# Patient Record
Sex: Male | Born: 1970 | Race: White | Hispanic: No | Marital: Single
Health system: Southern US, Community
[De-identification: ages and names within clinical notes are randomized; demographics above are authoritative.]

---

## 2007-05-24 ENCOUNTER — Ambulatory Visit: Payer: Self-pay | Admitting: Internal Medicine

## 2007-05-24 LAB — CONVERTED CEMR LAB
ALT: 174 units/L — ABNORMAL HIGH (ref 0–53)
AST: 73 units/L — ABNORMAL HIGH (ref 0–37)
Albumin: 4.2 g/dL (ref 3.5–5.2)
Alkaline Phosphatase: 45 units/L (ref 39–117)
BUN: 8 mg/dL (ref 6–23)
Bacteria, UA: NEGATIVE
Basophils Absolute: 0.1 10*3/uL (ref 0.0–0.1)
Basophils Relative: 0.6 % (ref 0.0–1.0)
Bilirubin, Direct: 0.2 mg/dL (ref 0.0–0.3)
CO2: 30 meq/L (ref 19–32)
Calcium: 9.4 mg/dL (ref 8.4–10.5)
Chloride: 107 meq/L (ref 96–112)
Cholesterol: 185 mg/dL (ref 0–200)
Creatinine, Ser: 0.8 mg/dL (ref 0.4–1.5)
Crystals: NEGATIVE
Eosinophils Absolute: 0.1 10*3/uL (ref 0.0–0.6)
Eosinophils Relative: 1 % (ref 0.0–5.0)
GFR calc Af Amer: 140 mL/min
GFR calc non Af Amer: 116 mL/min
Glucose, Bld: 99 mg/dL (ref 70–99)
HCT: 49.8 % (ref 39.0–52.0)
HDL: 39.1 mg/dL (ref >39.0)
Hemoglobin: 17.5 g/dL — ABNORMAL HIGH (ref 13.0–17.0)
LDL Cholesterol: 124 mg/dL — ABNORMAL HIGH (ref 0–99)
Leukocytes, UA: NEGATIVE
Lymphocytes Relative: 29.2 % (ref 12.0–46.0)
MCHC: 35.2 g/dL (ref 30.0–36.0)
MCV: 92.6 fL (ref 78.0–100.0)
Monocytes Absolute: 0.8 10*3/uL — ABNORMAL HIGH (ref 0.2–0.7)
Monocytes Relative: 8.9 % (ref 3.0–11.0)
Neutro Abs: 5.5 10*3/uL (ref 1.4–7.7)
Neutrophils Relative %: 60.3 % (ref 43.0–77.0)
Nitrite: NEGATIVE
Platelets: 274 10*3/uL (ref 150–400)
Potassium: 4.2 meq/L (ref 3.5–5.1)
RBC: 5.38 M/uL (ref 4.22–5.81)
RDW: 12.1 % (ref 11.5–14.6)
Sodium: 142 meq/L (ref 135–145)
Specific Gravity, Urine: 1.025 (ref 1.000–1.03)
TSH: 1.1 microintl units/mL (ref 0.35–5.50)
Total Bilirubin: 1.3 mg/dL — ABNORMAL HIGH (ref 0.3–1.2)
Total CHOL/HDL Ratio: 4.7
Total Protein, Urine: 30 mg/dL — AB
Total Protein: 7.4 g/dL (ref 6.0–8.3)
Triglycerides: 110 mg/dL (ref 0–149)
Urine Glucose: NEGATIVE mg/dL
Urobilinogen, UA: 0.2 (ref 0.0–1.0)
VLDL: 22 mg/dL (ref 0–40)
WBC: 9.2 10*3/uL (ref 4.5–10.5)
pH: 6 (ref 5.0–8.0)

## 2007-05-29 ENCOUNTER — Encounter: Payer: Self-pay | Admitting: Internal Medicine

## 2007-05-29 ENCOUNTER — Encounter: Payer: Self-pay | Admitting: *Deleted

## 2007-05-29 DIAGNOSIS — M7730 Calcaneal spur, unspecified foot: Secondary | ICD-10-CM

## 2007-05-30 ENCOUNTER — Encounter: Payer: Self-pay | Admitting: Internal Medicine

## 2007-05-30 ENCOUNTER — Ambulatory Visit: Payer: Self-pay | Admitting: Internal Medicine

## 2007-05-30 DIAGNOSIS — R319 Hematuria, unspecified: Secondary | ICD-10-CM | POA: Insufficient documentation

## 2007-05-30 DIAGNOSIS — I10 Essential (primary) hypertension: Secondary | ICD-10-CM | POA: Insufficient documentation

## 2007-05-30 DIAGNOSIS — F528 Other sexual dysfunction not due to a substance or known physiological condition: Secondary | ICD-10-CM

## 2007-05-30 DIAGNOSIS — R945 Abnormal results of liver function studies: Secondary | ICD-10-CM | POA: Insufficient documentation

## 2007-05-30 LAB — CONVERTED CEMR LAB
A-1 Antitrypsin, Ser: 165 mg/dL (ref 83–200)
Albumin ELP: 60.4 % (ref 55.8–66.1)
Alpha-1-Globulin: 3.8 % (ref 2.9–4.9)
Alpha-2-Globulin: 8.3 % (ref 7.1–11.8)
Anti Nuclear Antibody(ANA): NEGATIVE
Beta Globulin: 6.2 % (ref 4.7–7.2)
Ceruloplasmin: 41 mg/dL (ref 21–63)
Ferritin: 944.8 ng/mL — ABNORMAL HIGH (ref 22.0–322.0)
Gamma Globulin: 14.5 % (ref 11.1–18.8)
HCV Ab: NEGATIVE
Hep B S Ab: NEGATIVE
Iron: 273 ug/dL — ABNORMAL HIGH (ref 42–165)
Saturation Ratios: 93.8 % — ABNORMAL HIGH (ref 20.0–50.0)
Testosterone: 294.59 ng/dL — ABNORMAL LOW (ref 350.00–890)
Tissue Transglutaminase Ab, IgA: 0.5 units (ref <7)
Total Protein, Serum Electrophoresis: 8.1 g/dL (ref 6.0–8.3)
Transferrin: 208 mg/dL — ABNORMAL LOW (ref >=212.0)

## 2007-05-31 ENCOUNTER — Encounter: Payer: Self-pay | Admitting: Internal Medicine

## 2007-06-05 ENCOUNTER — Ambulatory Visit: Payer: Self-pay | Admitting: Cardiology

## 2007-06-28 ENCOUNTER — Ambulatory Visit: Payer: Self-pay | Admitting: Internal Medicine

## 2007-06-28 DIAGNOSIS — R319 Hematuria, unspecified: Secondary | ICD-10-CM

## 2007-06-28 LAB — CONVERTED CEMR LAB
INR: 1 (ref 0.8–1.0)
Prothrombin Time: 12.1 s (ref 10.9–13.3)

## 2007-07-01 ENCOUNTER — Encounter: Payer: Self-pay | Admitting: Internal Medicine

## 2007-07-02 ENCOUNTER — Ambulatory Visit: Payer: Self-pay | Admitting: Internal Medicine

## 2007-07-02 DIAGNOSIS — E291 Testicular hypofunction: Secondary | ICD-10-CM | POA: Insufficient documentation

## 2007-07-03 LAB — CONVERTED CEMR LAB
BUN: 7 mg/dL (ref 6–23)
CO2: 31 meq/L (ref 19–32)
Calcium: 9.4 mg/dL (ref 8.4–10.5)
Chloride: 103 meq/L (ref 96–112)
Creatinine, Ser: 0.9 mg/dL (ref 0.4–1.5)
GFR calc Af Amer: 121 mL/min
GFR calc non Af Amer: 100 mL/min
Glucose, Bld: 108 mg/dL — ABNORMAL HIGH (ref 70–99)
Potassium: 4.1 meq/L (ref 3.5–5.1)
Sodium: 140 meq/L (ref 135–145)

## 2007-07-05 ENCOUNTER — Encounter (INDEPENDENT_AMBULATORY_CARE_PROVIDER_SITE_OTHER): Payer: Self-pay | Admitting: *Deleted

## 2007-07-05 ENCOUNTER — Encounter: Payer: Self-pay | Admitting: Internal Medicine

## 2007-07-23 ENCOUNTER — Ambulatory Visit: Payer: Self-pay | Admitting: Internal Medicine

## 2007-07-30 ENCOUNTER — Ambulatory Visit: Payer: Self-pay | Admitting: Internal Medicine

## 2007-08-07 ENCOUNTER — Ambulatory Visit: Payer: Self-pay | Admitting: Endocrinology

## 2007-08-09 ENCOUNTER — Ambulatory Visit: Payer: Self-pay | Admitting: Internal Medicine

## 2007-08-10 LAB — CONVERTED CEMR LAB
FSH: 5.8 milliintl units/mL
LH: 3.9 milliintl units/mL
Prolactin: 10.9 ng/mL
Testosterone: 387.32 ng/dL (ref 350.00–890)

## 2007-08-15 ENCOUNTER — Telehealth (INDEPENDENT_AMBULATORY_CARE_PROVIDER_SITE_OTHER): Payer: Self-pay | Admitting: *Deleted

## 2007-08-16 ENCOUNTER — Ambulatory Visit: Payer: Self-pay | Admitting: Internal Medicine

## 2007-08-27 ENCOUNTER — Ambulatory Visit: Payer: Self-pay | Admitting: Internal Medicine

## 2007-08-27 LAB — CONVERTED CEMR LAB
Basophils Absolute: 0 10*3/uL (ref 0.0–0.1)
Eosinophils Absolute: 0.1 10*3/uL (ref 0.0–0.6)
Lymphocytes Relative: 27.6 % (ref 12.0–46.0)
MCHC: 33.6 g/dL (ref 30.0–36.0)
MCV: 94.5 fL (ref 78.0–100.0)
Neutro Abs: 5.1 10*3/uL (ref 1.4–7.7)
Platelets: 296 10*3/uL (ref 150–400)
RBC: 4.64 M/uL (ref 4.22–5.81)

## 2007-09-04 ENCOUNTER — Ambulatory Visit: Payer: Self-pay | Admitting: Internal Medicine

## 2007-09-04 LAB — CONVERTED CEMR LAB: Hemoglobin: 14.6 g/dL (ref 13.0–17.0)

## 2007-09-11 ENCOUNTER — Ambulatory Visit: Payer: Self-pay | Admitting: Internal Medicine

## 2007-09-11 LAB — CONVERTED CEMR LAB
Basophils Relative: 0.1 % (ref 0.0–1.0)
Eosinophils Absolute: 0.1 10*3/uL (ref 0.0–0.6)
Eosinophils Relative: 1 % (ref 0.0–5.0)
Lymphocytes Relative: 35.1 % (ref 12.0–46.0)
MCV: 94.3 fL (ref 78.0–100.0)
Monocytes Relative: 8.6 % (ref 3.0–11.0)
Neutro Abs: 4 10*3/uL (ref 1.4–7.7)
Platelets: 270 10*3/uL (ref 150–400)
RBC: 4.44 M/uL (ref 4.22–5.81)
WBC: 7.1 10*3/uL (ref 4.5–10.5)

## 2007-09-18 ENCOUNTER — Ambulatory Visit: Payer: Self-pay | Admitting: Internal Medicine

## 2007-09-18 LAB — CONVERTED CEMR LAB
HCT: 44.4 % (ref 39.0–52.0)
Hemoglobin: 15.2 g/dL (ref 13.0–17.0)

## 2007-09-25 ENCOUNTER — Ambulatory Visit: Payer: Self-pay | Admitting: Internal Medicine

## 2007-09-25 LAB — CONVERTED CEMR LAB
Basophils Relative: 3.3 % — ABNORMAL HIGH (ref 0.0–1.0)
Eosinophils Relative: 1 % (ref 0.0–5.0)
Lymphocytes Relative: 27 % (ref 12.0–46.0)
Monocytes Relative: 6.7 % (ref 3.0–11.0)
Platelets: 295 10*3/uL (ref 150–400)
RBC: 4.55 M/uL (ref 4.22–5.81)
RDW: 12.6 % (ref 11.5–14.6)
WBC: 7.1 10*3/uL (ref 4.5–10.5)

## 2007-10-01 ENCOUNTER — Ambulatory Visit: Payer: Self-pay | Admitting: Internal Medicine

## 2007-10-03 ENCOUNTER — Ambulatory Visit: Payer: Self-pay | Admitting: Internal Medicine

## 2007-10-03 LAB — CONVERTED CEMR LAB
HCT: 41.7 % (ref 39.0–52.0)
Hemoglobin: 14.4 g/dL (ref 13.0–17.0)

## 2007-10-10 ENCOUNTER — Ambulatory Visit: Payer: Self-pay | Admitting: Internal Medicine

## 2007-10-10 LAB — CONVERTED CEMR LAB
HCT: 44.8 % (ref 39.0–52.0)
Hemoglobin: 15.2 g/dL (ref 13.0–17.0)

## 2007-10-17 ENCOUNTER — Ambulatory Visit: Payer: Self-pay | Admitting: Internal Medicine

## 2007-10-17 LAB — CONVERTED CEMR LAB
HCT: 42.1 % (ref 39.0–52.0)
Hemoglobin: 14.3 g/dL (ref 13.0–17.0)

## 2007-10-24 ENCOUNTER — Ambulatory Visit: Payer: Self-pay | Admitting: Internal Medicine

## 2007-10-24 LAB — CONVERTED CEMR LAB
HCT: 44.3 % (ref 39.0–52.0)
Hemoglobin: 14.9 g/dL (ref 13.0–17.0)

## 2007-10-31 ENCOUNTER — Ambulatory Visit: Payer: Self-pay | Admitting: Internal Medicine

## 2007-10-31 LAB — CONVERTED CEMR LAB: Hemoglobin: 13.9 g/dL (ref 13.0–17.0)

## 2007-11-07 ENCOUNTER — Ambulatory Visit: Payer: Self-pay | Admitting: Internal Medicine

## 2007-11-07 LAB — CONVERTED CEMR LAB
Basophils Absolute: 0 10*3/uL (ref 0.0–0.1)
Basophils Relative: 0 % (ref 0.0–1.0)
HCT: 42.9 % (ref 39.0–52.0)
Hemoglobin: 14.5 g/dL (ref 13.0–17.0)
Lymphocytes Relative: 25.7 % (ref 12.0–46.0)
MCHC: 33.8 g/dL (ref 30.0–36.0)
MCV: 93.2 fL (ref 78.0–100.0)
Monocytes Absolute: 0.6 10*3/uL (ref 0.1–1.0)
Neutro Abs: 6.1 10*3/uL (ref 1.4–7.7)
RBC: 4.6 M/uL (ref 4.22–5.81)
RDW: 11.5 % (ref 11.5–14.6)

## 2007-11-14 ENCOUNTER — Ambulatory Visit: Payer: Self-pay | Admitting: Internal Medicine

## 2007-11-21 ENCOUNTER — Ambulatory Visit: Payer: Self-pay | Admitting: Internal Medicine

## 2007-11-21 LAB — CONVERTED CEMR LAB: HCT: 39.6 % (ref 39.0–52.0)

## 2007-11-28 ENCOUNTER — Ambulatory Visit: Payer: Self-pay | Admitting: Internal Medicine

## 2007-12-12 ENCOUNTER — Ambulatory Visit: Payer: Self-pay | Admitting: Internal Medicine

## 2007-12-16 LAB — CONVERTED CEMR LAB
HCT: 38.1 % — ABNORMAL LOW (ref 39.0–52.0)
Hemoglobin: 12.5 g/dL — ABNORMAL LOW (ref 13.0–17.0)

## 2008-01-15 ENCOUNTER — Ambulatory Visit: Payer: Self-pay | Admitting: Internal Medicine

## 2008-01-16 LAB — CONVERTED CEMR LAB
Eosinophils Relative: 0.7 % (ref 0.0–5.0)
Ferritin: 6.5 ng/mL — ABNORMAL LOW (ref 22.0–322.0)
Hemoglobin: 12.3 g/dL — ABNORMAL LOW (ref 13.0–17.0)
Lymphocytes Relative: 28 % (ref 12.0–46.0)
Monocytes Relative: 11.1 % (ref 3.0–12.0)
Neutro Abs: 4.1 10*3/uL (ref 1.4–7.7)
Platelets: 286 10*3/uL (ref 150–400)
RDW: 13.5 % (ref 11.5–14.6)
WBC: 6.8 10*3/uL (ref 4.5–10.5)

## 2008-04-03 ENCOUNTER — Telehealth: Payer: Self-pay | Admitting: Internal Medicine

## 2008-04-09 ENCOUNTER — Ambulatory Visit: Payer: Self-pay | Admitting: Internal Medicine

## 2008-04-09 LAB — CONVERTED CEMR LAB
Eosinophils Relative: 0.9 % (ref 0.0–5.0)
HCT: 42.9 % (ref 39.0–52.0)
Hemoglobin: 14.3 g/dL (ref 13.0–17.0)
Monocytes Absolute: 0.7 10*3/uL (ref 0.1–1.0)
Monocytes Relative: 8.7 % (ref 3.0–12.0)
Neutro Abs: 4.9 10*3/uL (ref 1.4–7.7)
WBC: 8.1 10*3/uL (ref 4.5–10.5)

## 2008-05-01 ENCOUNTER — Ambulatory Visit: Payer: Self-pay | Admitting: Internal Medicine

## 2008-05-01 DIAGNOSIS — H9209 Otalgia, unspecified ear: Secondary | ICD-10-CM | POA: Insufficient documentation

## 2008-07-02 ENCOUNTER — Ambulatory Visit: Payer: Self-pay | Admitting: Internal Medicine

## 2008-07-03 LAB — CONVERTED CEMR LAB
Basophils Absolute: 0 10*3/uL (ref 0.0–0.1)
Basophils Relative: 0 % (ref 0.0–3.0)
Eosinophils Absolute: 0.1 10*3/uL (ref 0.0–0.7)
MCHC: 34.9 g/dL (ref 30.0–36.0)
MCV: 83.6 fL (ref 78.0–100.0)
Neutrophils Relative %: 73.8 % (ref 43.0–77.0)
Platelets: 232 10*3/uL (ref 150–400)
RDW: 13.9 % (ref 11.5–14.6)

## 2008-09-25 ENCOUNTER — Ambulatory Visit: Payer: Self-pay | Admitting: Internal Medicine

## 2008-09-28 LAB — CONVERTED CEMR LAB
Basophils Absolute: 0 10*3/uL (ref 0.0–0.1)
Basophils Relative: 0.2 % (ref 0.0–3.0)
Lymphocytes Relative: 25.5 % (ref 12.0–46.0)
MCHC: 35.1 g/dL (ref 30.0–36.0)
Neutrophils Relative %: 66.7 % (ref 43.0–77.0)
RBC: 5.71 M/uL (ref 4.22–5.81)
WBC: 8.6 10*3/uL (ref 4.5–10.5)

## 2008-11-02 IMAGING — CT CT PELVIS W/ CM
3 of 9 series · 13 of 46 positions shown, 19 images · IV contrast (Omnipaque 300)
Comparison: none

CLINICAL DATA: Hematuria and abnormal LFTs.  
 ABDOMEN CT WITHOUT AND WITH CONTRAST:
TECHNIQUE: Multidetector CT imaging of the abdomen was performed both before and during bolus administration of intravenous contrast.
 Contrast:  125 cc Omnipaque 300
TECHNIQUE: Multidetector CT imaging of the pelvis was performed following the standard protocol during bolus administration of intravenous contrast.

[Series 5: arterial 3.0 b30f st · axial · arterial · 0.92mm/px · z∈[-250,-214]mm · 2 of 97 slices shown]
[im 13/97  soft-tissue]
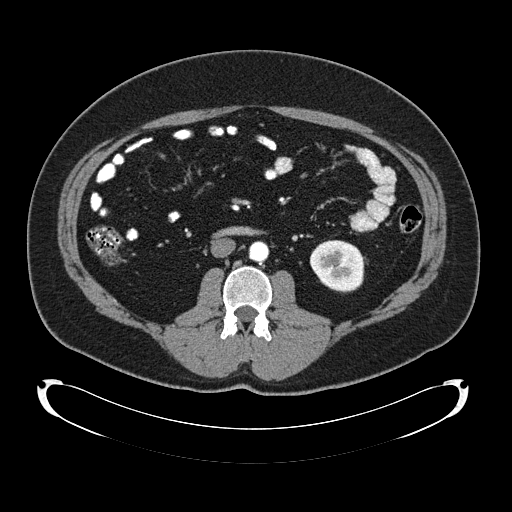
[im 25/97  soft-tissue]
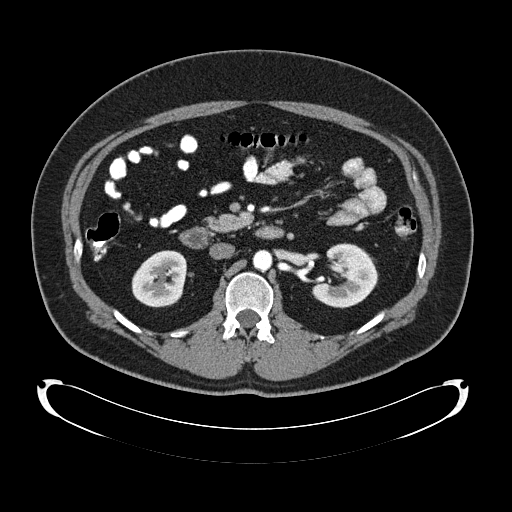

[Series 6: ap_venous 5.0 b30f · axial · 0.92mm/px · z∈[-478,-58]mm · 8 of 108 slices shown, 13 images]
[im 12/108  soft-tissue]
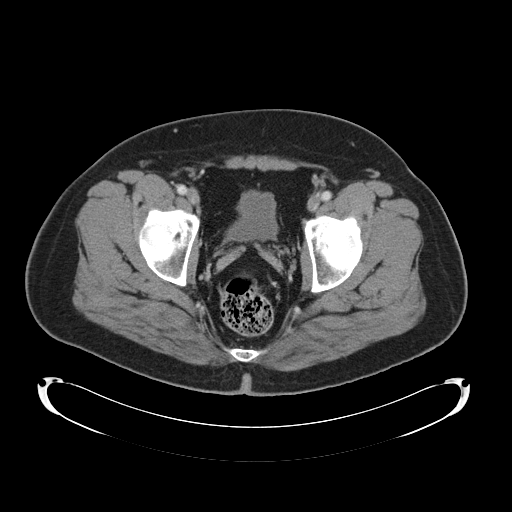
[im 12/108  bone]
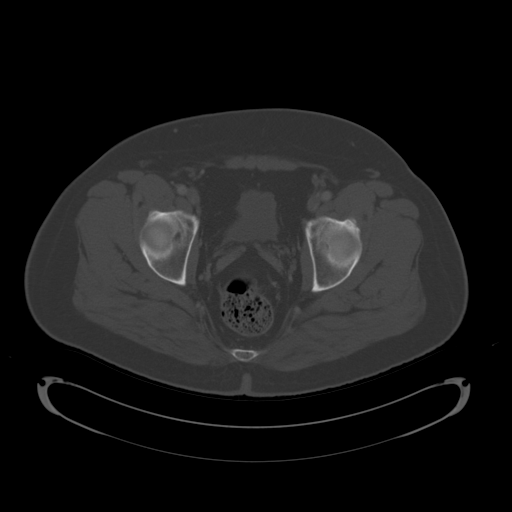
[im 24/108  soft-tissue]
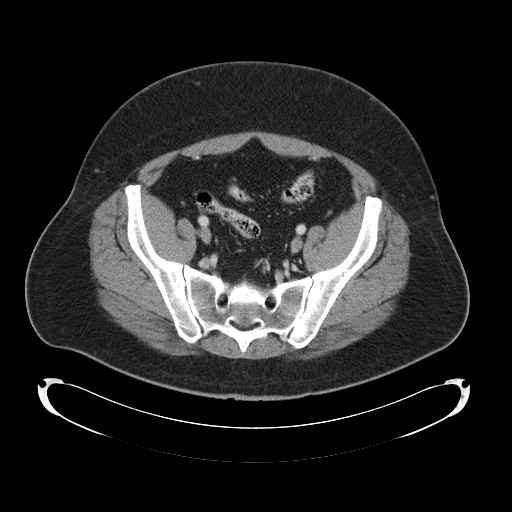
[im 36/108  soft-tissue]
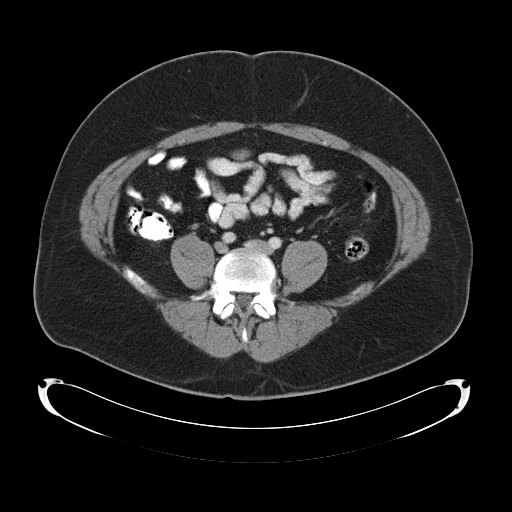
[im 48/108  soft-tissue]
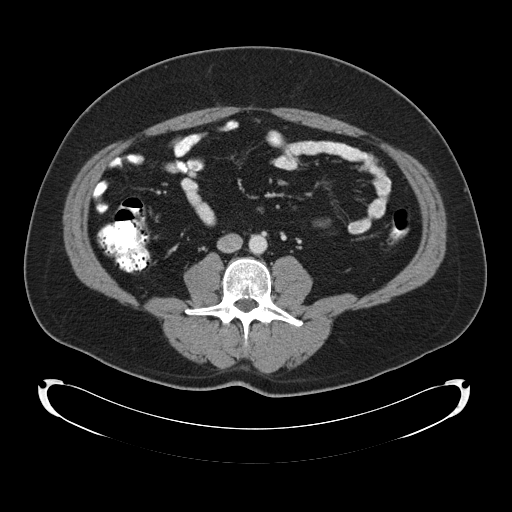
[im 60/108  soft-tissue]
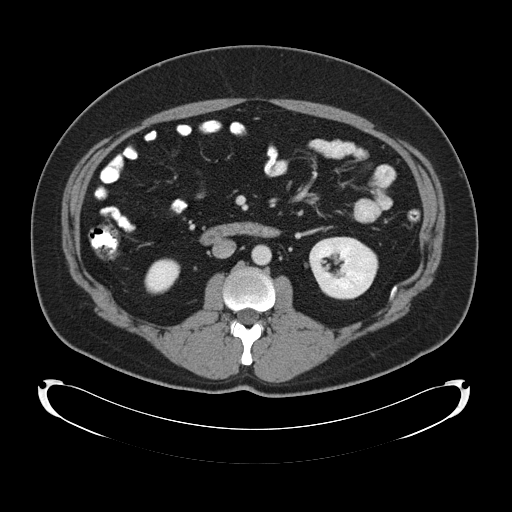
[im 60/108  lung]
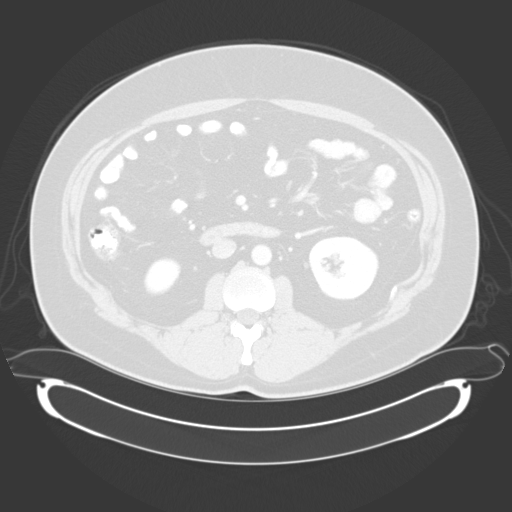
[im 72/108  soft-tissue]
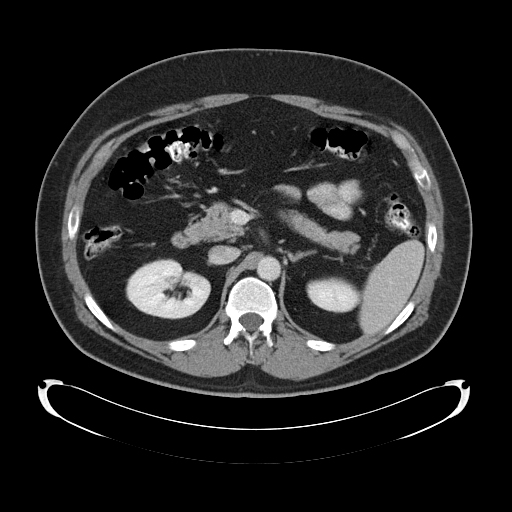
[im 72/108  lung]
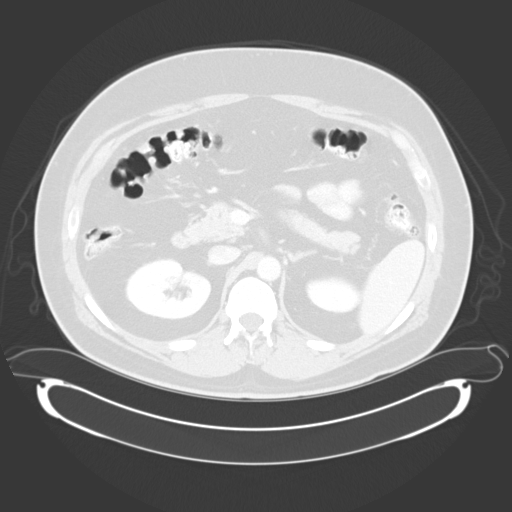
[im 84/108  soft-tissue]
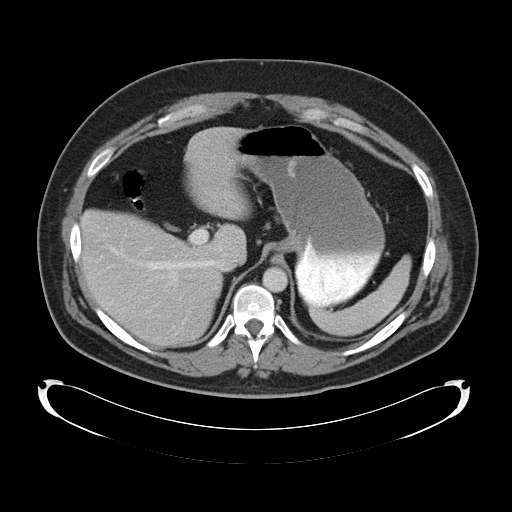
[im 84/108  lung]
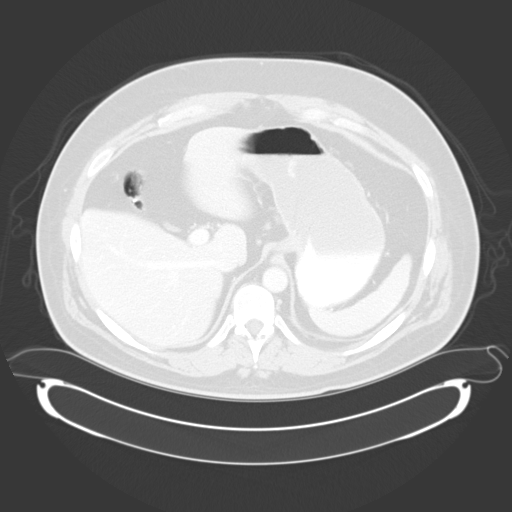
[im 96/108  soft-tissue]
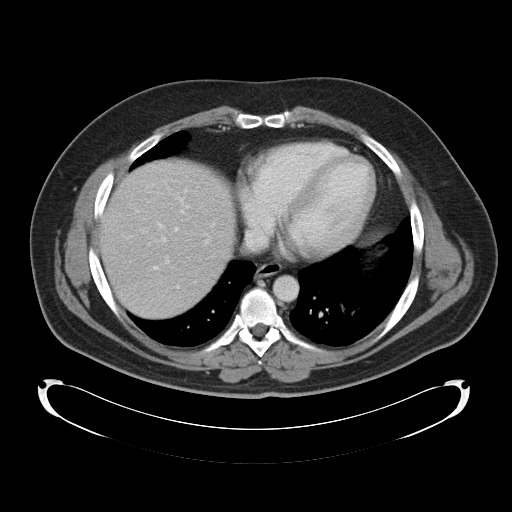
[im 96/108  lung]
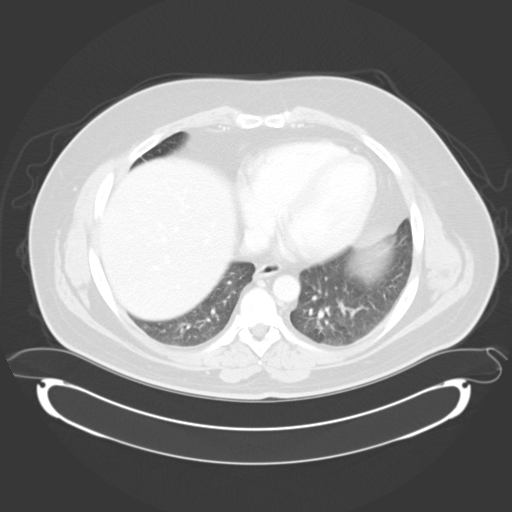

[Series 602: <mpr thick range> · coronal · 0.92mm/px · 3 of 84 slices shown, 4 images]
[im 21/84  soft-tissue]
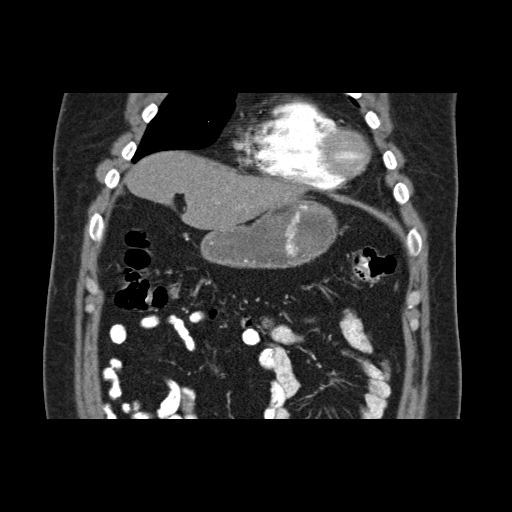
[im 42/84  soft-tissue]
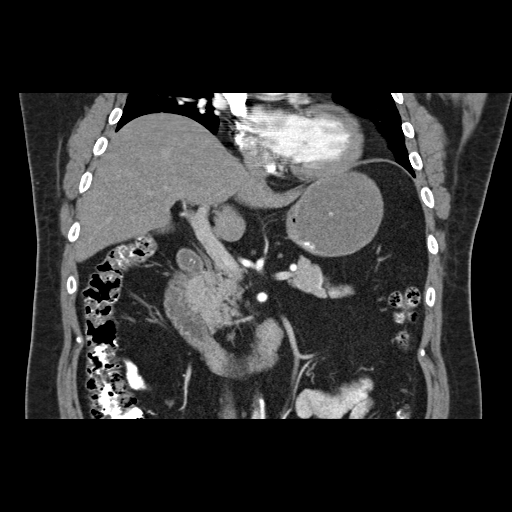
[im 42/84  bone]
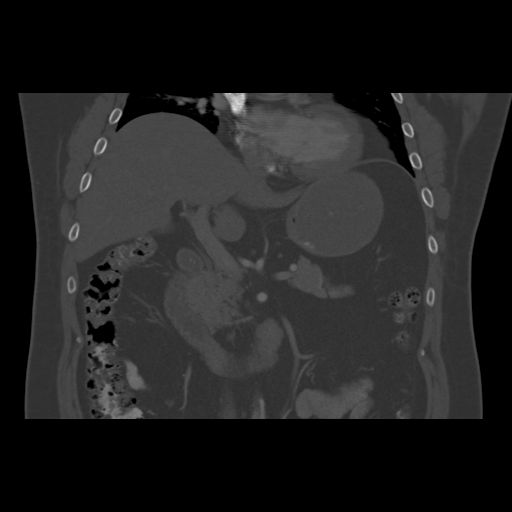
[im 63/84  soft-tissue]
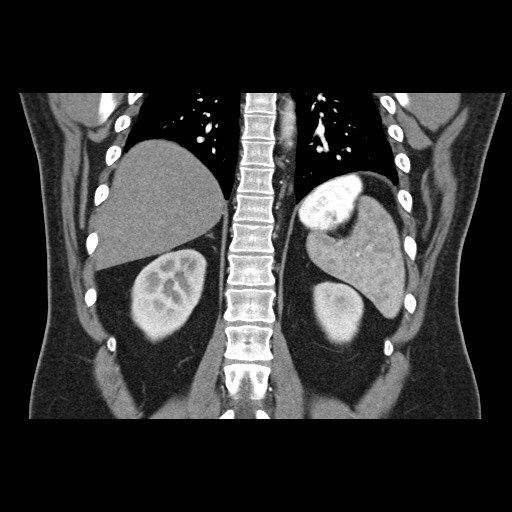

[13 of 46 positions shown; findings below may reference images not displayed]

FINDINGS: The lung bases are clear.  
 The liver is normal in attenuation, morphology, and enhancement.  
 Both kidneys are normal.  
 No stones, hydronephrosis, or masses identified.  
 Spleen is normal.  
 Adrenal glands are normal. 
 Pancreas is normal. 
 There are no enlarged retroperitoneal or small bowel mesenteric lymph nodes.  
 Bowel loops of the upper abdomen are unremarkable.  
 There is no adenopathy.  
 Review of the bone windows shows no lytic or sclerotic lesions.
IMPRESSION: No findings to explain the patient?s hematuria.
 PELVIS CT WITH CONTRAST:
FINDINGS: Urinary bladder is normal. 
 No enlarged pelvic or inguinal lymph nodes.  
 The pelvic bowel loops are unremarkable. 
 No groin adenopathy.
 The urinary bladder has a normal appearance.  
 Review of the bone windows is unremarkable.
IMPRESSION: 1.  Normal pelvis CT.  
 2.  No findings to explain patient?s hematuria.

## 2008-12-29 ENCOUNTER — Telehealth: Payer: Self-pay | Admitting: Internal Medicine

## 2009-02-19 ENCOUNTER — Ambulatory Visit: Payer: Self-pay | Admitting: Internal Medicine

## 2009-02-22 LAB — CONVERTED CEMR LAB
Basophils Relative: 2 % (ref 0.0–3.0)
Eosinophils Absolute: 0.1 10*3/uL (ref 0.0–0.7)
Hemoglobin: 15.8 g/dL (ref 13.0–17.0)
Lymphocytes Relative: 27 % (ref 12.0–46.0)
MCHC: 34.5 g/dL (ref 30.0–36.0)
Monocytes Relative: 9.1 % (ref 3.0–12.0)
Neutro Abs: 5.3 10*3/uL (ref 1.4–7.7)
RBC: 5.4 M/uL (ref 4.22–5.81)

## 2009-05-19 ENCOUNTER — Ambulatory Visit: Payer: Self-pay | Admitting: Internal Medicine

## 2009-08-17 ENCOUNTER — Ambulatory Visit: Payer: Self-pay | Admitting: Internal Medicine

## 2009-08-18 LAB — CONVERTED CEMR LAB
Basophils Relative: 0 % (ref 0.0–3.0)
Bilirubin, Direct: 0.1 mg/dL (ref 0.0–0.3)
Eosinophils Absolute: 0.1 10*3/uL (ref 0.0–0.7)
Ferritin: 20.1 ng/mL — ABNORMAL LOW (ref 22.0–322.0)
Lymphs Abs: 2.3 10*3/uL (ref 0.7–4.0)
MCHC: 32.8 g/dL (ref 30.0–36.0)
MCV: 90.1 fL (ref 78.0–100.0)
Monocytes Absolute: 0.6 10*3/uL (ref 0.1–1.0)
Neutrophils Relative %: 64.5 % (ref 43.0–77.0)
Platelets: 261 10*3/uL (ref 150.0–400.0)
Total Bilirubin: 0.8 mg/dL (ref 0.3–1.2)
Total Protein: 7.1 g/dL (ref 6.0–8.3)

## 2009-09-29 ENCOUNTER — Ambulatory Visit: Payer: Self-pay | Admitting: Internal Medicine

## 2009-09-29 LAB — CONVERTED CEMR LAB
Basophils Relative: 0.7 % (ref 0.0–3.0)
Eosinophils Absolute: 0.1 10*3/uL (ref 0.0–0.7)
HCT: 51.7 % (ref 39.0–52.0)
Hemoglobin: 17 g/dL (ref 13.0–17.0)
Lymphs Abs: 2.9 10*3/uL (ref 0.7–4.0)
MCHC: 33 g/dL (ref 30.0–36.0)
MCV: 92.2 fL (ref 78.0–100.0)
Monocytes Absolute: 1 10*3/uL (ref 0.1–1.0)
Neutro Abs: 4.9 10*3/uL (ref 1.4–7.7)
RBC: 5.6 M/uL (ref 4.22–5.81)

## 2009-10-01 LAB — CONVERTED CEMR LAB
ALT: 41 units/L (ref 0–53)
AST: 32 units/L (ref 0–37)
Alkaline Phosphatase: 51 units/L (ref 39–117)
Basophils Relative: 0.9 % (ref 0.0–3.0)
Bilirubin, Direct: 0.2 mg/dL (ref 0.0–0.3)
Eosinophils Relative: 0.9 % (ref 0.0–5.0)
Ferritin: 8.8 ng/mL — ABNORMAL LOW (ref 22.0–322.0)
MCV: 88.6 fL (ref 78.0–100.0)
Monocytes Absolute: 0.5 10*3/uL (ref 0.1–1.0)
Monocytes Relative: 6.4 % (ref 3.0–12.0)
Neutrophils Relative %: 66.7 % (ref 43.0–77.0)
RBC: 5.44 M/uL (ref 4.22–5.81)
Total Bilirubin: 0.7 mg/dL (ref 0.3–1.2)
WBC: 8.3 10*3/uL (ref 4.5–10.5)

## 2009-12-07 ENCOUNTER — Ambulatory Visit: Payer: Self-pay | Admitting: Internal Medicine

## 2009-12-09 LAB — CONVERTED CEMR LAB
ALT: 27 units/L (ref 0–53)
Basophils Absolute: 0.1 10*3/uL (ref 0.0–0.1)
Bilirubin, Direct: 0.2 mg/dL (ref 0.0–0.3)
Eosinophils Absolute: 0.1 10*3/uL (ref 0.0–0.7)
Ferritin: 6.6 ng/mL — ABNORMAL LOW (ref 22.0–322.0)
HCT: 47.1 % (ref 39.0–52.0)
Lymphs Abs: 2.5 10*3/uL (ref 0.7–4.0)
MCHC: 33.9 g/dL (ref 30.0–36.0)
MCV: 86 fL (ref 78.0–100.0)
Monocytes Absolute: 0.5 10*3/uL (ref 0.1–1.0)
Neutrophils Relative %: 67.4 % (ref 43.0–77.0)
Platelets: 325 10*3/uL (ref 150.0–400.0)
RDW: 13.8 % (ref 11.5–14.6)
Total Bilirubin: 0.7 mg/dL (ref 0.3–1.2)
WBC: 9.8 10*3/uL (ref 4.5–10.5)

## 2010-04-05 ENCOUNTER — Ambulatory Visit: Payer: Self-pay | Admitting: Internal Medicine

## 2010-04-06 LAB — CONVERTED CEMR LAB
ALT: 25 units/L (ref 0–53)
AST: 23 units/L (ref 0–37)
Alkaline Phosphatase: 41 units/L (ref 39–117)
Basophils Relative: 0.4 % (ref 0.0–3.0)
Eosinophils Relative: 0.7 % (ref 0.0–5.0)
Ferritin: 8.6 ng/mL — ABNORMAL LOW (ref 22.0–322.0)
HCT: 46.5 % (ref 39.0–52.0)
Lymphs Abs: 2.6 10*3/uL (ref 0.7–4.0)
MCV: 87.3 fL (ref 78.0–100.0)
Monocytes Absolute: 0.6 10*3/uL (ref 0.1–1.0)
Monocytes Relative: 5.3 % (ref 3.0–12.0)
Platelets: 293 10*3/uL (ref 150.0–400.0)
RBC: 5.32 M/uL (ref 4.22–5.81)
Total Bilirubin: 0.9 mg/dL (ref 0.3–1.2)
WBC: 10.6 10*3/uL — ABNORMAL HIGH (ref 4.5–10.5)

## 2010-08-12 ENCOUNTER — Other Ambulatory Visit: Payer: Self-pay | Admitting: Internal Medicine

## 2010-08-12 ENCOUNTER — Ambulatory Visit
Admission: RE | Admit: 2010-08-12 | Discharge: 2010-08-12 | Payer: Self-pay | Source: Home / Self Care | Attending: Internal Medicine | Admitting: Internal Medicine

## 2010-08-12 ENCOUNTER — Telehealth: Payer: Self-pay | Admitting: Internal Medicine

## 2010-08-12 LAB — HEPATIC FUNCTION PANEL
ALT: 22 U/L (ref 0–53)
AST: 23 U/L (ref 0–37)
Albumin: 4.4 g/dL (ref 3.5–5.2)
Alkaline Phosphatase: 52 U/L (ref 39–117)
Bilirubin, Direct: 0.2 mg/dL (ref 0.0–0.3)
Total Bilirubin: 1.1 mg/dL (ref 0.3–1.2)
Total Protein: 7.6 g/dL (ref 6.0–8.3)

## 2010-08-12 LAB — CBC WITH DIFFERENTIAL/PLATELET
Basophils Absolute: 0 10*3/uL (ref 0.0–0.1)
Basophils Relative: 0.2 % (ref 0.0–3.0)
Eosinophils Absolute: 0.1 10*3/uL (ref 0.0–0.7)
Eosinophils Relative: 1.1 % (ref 0.0–5.0)
HCT: 48.7 % (ref 39.0–52.0)
Hemoglobin: 16.3 g/dL (ref 13.0–17.0)
Lymphocytes Relative: 23.7 % (ref 12.0–46.0)
Lymphs Abs: 2.6 10*3/uL (ref 0.7–4.0)
MCHC: 33.5 g/dL (ref 30.0–36.0)
MCV: 86.1 fl (ref 78.0–100.0)
Monocytes Absolute: 0.9 10*3/uL (ref 0.1–1.0)
Monocytes Relative: 8.5 % (ref 3.0–12.0)
Neutro Abs: 7.3 10*3/uL (ref 1.4–7.7)
Neutrophils Relative %: 66.5 % (ref 43.0–77.0)
Platelets: 303 10*3/uL (ref 150.0–400.0)
RBC: 5.65 Mil/uL (ref 4.22–5.81)
RDW: 14.7 % — ABNORMAL HIGH (ref 11.5–14.6)
WBC: 11 10*3/uL — ABNORMAL HIGH (ref 4.5–10.5)

## 2010-08-12 LAB — FERRITIN: Ferritin: 14.4 ng/mL — ABNORMAL LOW (ref 22.0–322.0)

## 2010-08-30 NOTE — Miscellaneous (Signed)
Summary: Orders Update  Clinical Lists Changes  Orders: Added new Referral order of Urology Referral (Urology) - Signed   Call Pt - he has persistent blood in his urine.  His CT of Abd and Pelvis was negative but I rec evaluation by urologist. left voice message at [phone] to call ..................................................................Marland KitchenGlendell Docker   July 09, 2007 8:56 AM   Pt called back confirming he recieved voice message left regarding referral, however he had some additional questions regarding referral. I attempted to contact patient at [phone], left voice message to call. ..................................................................Marland KitchenGlendell Docker  July 10, 2007 1:13 PM   pt rerturned phone call. Questions asked and answered ..................................................................Marland KitchenGlendell Docker  July 10, 2007 1:30 PM

## 2010-08-30 NOTE — Assessment & Plan Note (Signed)
Summary: NEW ENDO CONSULT/DR YOO/HYPOGONADISM/SECONDARY TO HEMOCHROMAT...   Vital Signs:  Patient Profile:   40 Years Old Male Height:     70 inches Weight:      253.2 pounds Temp:     97.9 degrees F oral Pulse rate:   61 / minute BP sitting:   122 / 76  (right arm) Cuff size:   large  Vitals Entered By: Orlan Leavens (August 07, 2007 8:04 AM)                 Visit Type:  Consult  Chief Complaint:  low testosterone.  History of Present Illness: was noted at a visit several months ago to have elevated hepatic transaminases.  Workup of this found hemochromatosis. Patient states one to 1 1/2 years of erectile dysfunction.  He has associated decreased libido. He was noted on laboratory evaluation to have a slightly low testosterone. he also, states he's had some fatigue recently  Current Allergies: ! 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)       HEMOCHROMATOSIS (ICD-275.0)       HEMATURIA UNSPECIFIED (ICD-599.70)       ERECTILE DYSFUNCTION (ICD-302.72)       HYPERTENSION, ESSENTIAL NOS (ICD-401.9)       HEMATURIA (ICD-599.7)       LIVER FUNCTION TESTS, ABNORMAL (ICD-794.8)       CALCANEAL SPUR, RIGHT (ICD-726.73)                 Family History:    Reviewed history from 05/30/2007 and no changes required:       Family History Hypertension       Uncle with history of stroke       Denies family history of liver disease  Social History:    Reviewed history from 05/30/2007 and no changes required:       Occupation: Health visitor       Never Smoked - but chews tobacco       Alcohol use-yes       Drug use-no       Regular exercise-no    Review of Systems  The patient denies vision loss, depression, and unusual weight change.         patient states occasional headache.   Physical Exam  General:     obese.   Head:     normocephalic  Eyes:     no  gross abnormality of the eyes.  no periorbital swelling, and no scleral icterus  Neck:     no masses, thyromegaly, or abnormal cervical nodes Breasts:     no gynecomastia Lungs:     clear to auscultation  Heart:     regular rate and rhythm, S1, S2 without murmurs, rubs, gallops, or clicks Abdomen:     obese Genitalia:     normal male, testes descended bilaterally without masses, no hernias noted Msk:     normal muscle bulk Extremities:     no edema Skin:     normal hair distribution Psych:     alert and cooperative; normal mood and affect; normal attention span and concentration    Impression & Recommendations:  Problem # 1:  HYPOGONADISM, 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 Laboratory test [phone]) Consultation Level IV (  [address])   Problem # 2:  HEMOCHROMATOSIS (ICD-275.0) uncertain if this is the cause of #1  Problem # 3:  ERECTILE DYSFUNCTION (ICD-302.72) uncertain if this is caused by #1  Problem # 4:  fatigue unlikely caused by #1  Medications Added to Medication List This Visit: 1)  Cialis 20 Mg Tabs (Tadalafil) .... Prn   Patient Instructions: 1)  weight loss is advised 2)  check FSH, LH, prolactin, testosterone, estradiol 3)  cialis 20 mg as needed 4)  we discussed the controversial nature of normalization of mildly low testosterone    Prescriptions: CIALIS 20 MG  TABS (TADALAFIL) prn  #5 x 11   Entered and Authorized by:   Minus Breeding MD   Signed by:   Minus Breeding MD on 08/07/2007   Method used:   Electronically sent to ...       [facility] [address]       [address]       [place], [place]  [address]       Ph: [phone]       Fax: [phone]   RxID:   [id number]  ]

## 2010-08-30 NOTE — Assessment & Plan Note (Signed)
Summary: BP   MEDS  REFILL/HEA   Vital Signs:  Patient Profile:   40 Years Old Male Height:     70 inches Weight:      247.75 pounds BMI:     35.68 Temp:     98.3 degrees F oral Pulse rate:   80 / minute Pulse rhythm:   regular Resp:     16 per minute BP sitting:   128 / 80  (left arm) Cuff size:   large                 Chief Complaint:  Medication Refills.  History of Present Illness:  Follow-Up Visit      This is a 40 year old man who presents for Follow-up visit.  The patient reports taking meds as prescribed, monitoring BP, dietary compliance, and exercising occasionaly.  When questioned about possible medication side effects, the patient notes none.  Patient is c/o ear and throat discomfort.    Current Allergies (reviewed today): ! 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.2)    HEMOCHROMATOSIS (ICD-275.0)    HEMATURIA UNSPECIFIED (ICD-599.70)    ERECTILE DYSFUNCTION (ICD-302.72)    HYPERTENSION, ESSENTIAL NOS (ICD-401.9)    HEMATURIA (ICD-599.7)    LIVER FUNCTION TESTS, ABNORMAL (ICD-794.8)    CALCANEAL SPUR, RIGHT (ICD-726.73)            Family History:    Family History Hypertension    Uncle with history of stroke    Denies family history of liver disease   Social History:    Occupation: Nature conservation officer    Never Smoked - but chews tobacco    Alcohol use-yes    Drug use-no    Regular exercise-no     Review of Systems      See HPI   Physical Exam  General:     alert, well-developed, and well-nourished.   Head:     normocephalic and atraumatic.   Ears:     R ear normal and L ear normal.   Mouth:     Oral mucosa and oropharynx without lesions or exudates.  Teeth in good repair. Neck:     supple and no masses.   Lungs:     normal respiratory effort and normal breath sounds.   Heart:     normal rate, regular rhythm, and no gallop.   Extremities:     No lower extremity edema     Impression &  Recommendations:  Problem # 1:  HYPERTENSION, ESSENTIAL NOS (ICD-401.9) Pt has not taken BP med x 1 wk.  He has started walking program.  BP is high normal.   Pt advised to hold benicar and monito BP at home.   His updated medication list for this problem includes:    Benicar 40 Mg Tabs (Olmesartan medoxomil) ..... One by mouth once daily  (hold)  BP today: 128/80 Prior BP: 149/89 (10/01/2007)  Labs Reviewed: Creat: 0.9 (07/02/2007) Chol: 185 (05/24/2007)   HDL: 39.1 (05/24/2007)   LDL: 124 (05/24/2007)   TG: 110 (05/24/2007)   Problem # 2:  ERECTILE DYSFUNCTION (ICD-302.72) Good response to Cialis.  Maintain current medication regimen.   His updated medication list for this problem includes:    Cialis 20 Mg Tabs (Tadalafil) ..... One by mouth once daily prn   Problem # 3:  HEMOCHROMATOSIS (ICD-275.0) Assessment: Comment Only Continue qtrly f/u with Dr. Marina Goodell for phlebotomy.  Problem # 4:  EAR PAIN (  ICD-388.70) Mild TM retraction BL.  I suspect eustachian tude dysfunction.   I advised nasal saline two times a day.  Patient advised to call office if symptoms persist or worsen.   Complete Medication List: 1)  Benicar 40 Mg Tabs (Olmesartan medoxomil) .... One by mouth once daily  (hold) 2)  Cialis 20 Mg Tabs (Tadalafil) .... One by mouth once daily prn  Other Orders: Influenza Vaccine NON MCR ([address]109)   Patient Instructions: 1)  Please schedule a follow-up appointment in 6 months.   ] Current Allergies (reviewed today): ! CODEINE   Influenza Vaccine    Vaccine Type: Fluvax Non-MCR    Site: left deltoid    Mfr: GlaxoSmithKline    Dose: 0.5 ml    Route: IM    Given by: Darra Lis RMA    Exp. Date: 01/27/2009    Lot #: [id number]  Flu Vaccine Consent Questions    Do you have a history of severe allergic reactions to this vaccine? no    Any prior history of allergic reactions to egg and/or gelatin? no    Do you have a sensitivity to the preservative Thimersol?  no    Do you have a past history of Guillan-Barre Syndrome? no    Do you currently have an acute febrile illness? no    Have you ever had a severe reaction to latex? no    Vaccine information given and explained to patient? yes

## 2010-09-01 NOTE — Progress Notes (Signed)
Summary: reminder to have labs.  ---- Converted from flag ---- ---- 04/08/2010 8:24 AM, Milford Cage NCMA wrote: Make sure pt. had labs/phlebotomy. ------------------------------  Phone Note Outgoing Call   Call placed by: Milford Cage NCMA,  August 12, 2010 8:06 AM Call placed to: Patient Summary of Call: Called patient to remind him that it is time for his labs.  He stated he has changed insurance carrier and I advised him to bring his new info to front desk so they can change in computer.   Initial call taken by: Milford Cage NCMA,  August 12, 2010 8:10 AM

## 2010-12-13 NOTE — Assessment & Plan Note (Signed)
[facility]                         GASTROENTEROLOGY OFFICE NOTE   RIPKEN, REKOWSKI                       MRN:          [id number]  DATE:07/23/2007                            DOB:          [birth year]    HISTORY:  Mr. Holway presents today for followup.  He is a 40 year old  who was evaluated June 28, 2007 for elevated liver tests and  possible hemochromatosis.  See that dictation for details.  HFE gene  testing was performed.  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  He has had no  interval clinical problems.   LIMITED PHYSICAL EXAMINATION:  Blood pressure 144/98, heart rate 64,  weight is 250.4 pounds.   IMPRESSION:  Hereditary hemochromatosis.  Patient's clinical history,  laboratories and genetic testing all quite consistent.  I do not feel  there is a need for confirmatory liver biopsy in this instance.   RECOMMENDATIONS:  1. Long discussion today regarding hemochromatosis.  2. Recommended that he discuss with his sibling his diagnosis and      recommend that she be screened.He has already done so.  3. Initiate regular therapeutic phlebotomy, initially of blood      weekly, to achieve iron limited hematopoiesis. Will check CBC prior      to each session, starting in 4 weeks.  4. Ongoing general medical care with Dr. Artist Pais.     Wilhemina Bonito. Marina Goodell, MD  Electronically Signed    JNP/MedQ  DD: 07/23/2007  DT: 07/23/2007  Job #: [id number]   cc:   Barbette Hair. Artist Pais, DO

## 2010-12-13 NOTE — Assessment & Plan Note (Signed)
[facility]                         GASTROENTEROLOGY OFFICE NOTE   Dan Garcia, Dan Garcia                       MRN:          [id number]  DATE:06/28/2007                            DOB:          05-11-1971    REFERRING PHYSICIAN:  Dr. Thomos Lemons   REASON FOR CONSULTATION:  Elevated liver tests and possible  hemachromatosis.   HISTORY:  This is a pleasant 40 year old white male with no significant  past medical history who recently established with Dr. Artist Pais as a new  patient.  At that time his only complaints were nonspecific fatigue and  erectile dysfunction.  He was found to be hypertensive and placed on  Benicar.  Multiple baseline laboratories were obtained.  CBC was normal  with hemoglobin of 17.5.  Basic metabolic panel was normal.  Liver tests  were abnormal with an SGOT of 73, SGPT 174, alkaline phosphatase 45, and  total bilirubin 1.3 (direct 0.2).  Albumin and protein were normal.  Urinalysis revealed microscopic hematuria.  The patient subsequently  underwent a CT scan of the abdomen with and without contrast June 05, 2007.  The abdomen was unremarkable including an entirely normal liver.  CT of the pelvis was also negative.  Because of the elevated liver  tests, additional laboratories were obtained.  Celiac panel, chronic  hepatitis serologies, alpha-1 antitrypsin, ceruloplasmin, antinuclear  antibody were all normal or negative.  Iron studies were abnormal with  total iron of 273 and an iron saturation of 93.8%.  His ferritin was  elevated at 944.8.  He also had a testosterone level drawn which was low  at 294.59.  Looking back at laboratories from 2001, the patient's  hemoglobin at that time was 16.1.  He did have an elevation of his SGPT  at 79.  Other liver tests were normal.  Microscopic hematuria noted at  that time as well.  He is now referred.  The patient denies arthralgias,  fevers, family history of liver disease, or  significant alcohol use.   PAST MEDICAL HISTORY:  Recently diagnosed hypertension.   PAST SURGICAL HISTORY:  None.   ALLERGIES:  CODEINE which causes throat swelling.   CURRENT MEDICATIONS:  Benicar 20 mg daily.   FAMILY HISTORY:  No family history of liver disease.  Mother with  diabetes, father with hypertension.   SOCIAL HISTORY:  The patient is single without children.  He has a  [employer] degree.  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  He does not smoke.  He rarely uses alcohol,  estimating a few beers per month.   REVIEW OF SYSTEMS:  Per diagnostic evaluation form.   PHYSICAL EXAMINATION:  Well-appearing male in no acute distress.  Blood  pressure is 120/90, heart rate 60 and regular, weight is 247.2 pounds.  He is 5 feet 9 inches in height.  HEENT:  Sclerae anicteric, conjunctivae pink.  Oral mucosa is intact.  There is no adenopathy.  LUNGS:  Clear.  HEART:  Regular.  ABDOMEN:  Soft without tenderness, mass or hernia.  No organomegaly,  good bowel sounds heard.  EXTREMITIES:  Without edema.   IMPRESSION:  This is a 40 year old male who is referred regarding  elevated liver tests as well as abnormal iron studies as described  above.  This, combined with his complaints of fatigue and erectile  dysfunction in conjunction with a low testosterone. is consistent with  hereditary hemochromatosis.  I discussed the condition in detail with  the patient today.He has researched the disorder and asked appropriate  questions all of which were answered to his satisfaction.  No evidence  of hepatic synthetic dysfunction based on laboratories or imaging study.   RECOMMENDATIONS:  1. HFE gene testing.  2. Return to Dr. Artist Pais for treatment of hypogonadism.  3. GI followup after genetic testing complete to decide if the patient      will be referred straightaway for therapeutic phlebotomy or      possible consideration of liver biopsy if the  diagnosis is at all      in doubt.     Wilhemina Bonito. Marina Goodell, MD  Electronically Signed    JNP/MedQ  DD: 06/28/2007  DT: 06/28/2007  Job #: [id number]   cc:   Barbette Hair. Artist Pais, DO

## 2011-01-31 ENCOUNTER — Other Ambulatory Visit: Payer: Self-pay | Admitting: Internal Medicine

## 2011-01-31 ENCOUNTER — Other Ambulatory Visit (INDEPENDENT_AMBULATORY_CARE_PROVIDER_SITE_OTHER): Payer: Self-pay

## 2011-01-31 LAB — CBC WITH DIFFERENTIAL/PLATELET
Basophils Absolute: 0.1 10*3/uL (ref 0.0–0.1)
Eosinophils Absolute: 0.1 10*3/uL (ref 0.0–0.7)
HCT: 45.7 % (ref 39.0–52.0)
Lymphs Abs: 3.1 10*3/uL (ref 0.7–4.0)
MCHC: 34.9 g/dL (ref 30.0–36.0)
MCV: 87.8 fl (ref 78.0–100.0)
Monocytes Absolute: 0.7 10*3/uL (ref 0.1–1.0)
Platelets: 263 10*3/uL (ref 150.0–400.0)
RDW: 13.8 % (ref 11.5–14.6)

## 2011-01-31 LAB — FERRITIN: Ferritin: 17.6 ng/mL — ABNORMAL LOW (ref 22.0–322.0)

## 2011-02-03 ENCOUNTER — Telehealth: Payer: Self-pay

## 2011-02-03 NOTE — Telephone Encounter (Signed)
Patient aware.  New lab orders placed

## 2011-02-03 NOTE — Telephone Encounter (Signed)
Message copied by Annett Fabian on Fri Feb 03, 2011 11:32 AM ------      Message from: Hilarie Fredrickson      Created: Thu Feb 02, 2011  7:45 PM       Please let patient know that his labs look good. Please repeat phlebotomy in 6 months. Get CBC and ferritin just prior to that.Marland KitchenMarland Kitchen

## 2011-07-27 ENCOUNTER — Other Ambulatory Visit (INDEPENDENT_AMBULATORY_CARE_PROVIDER_SITE_OTHER): Payer: Self-pay

## 2011-07-27 LAB — CBC WITH DIFFERENTIAL/PLATELET
Basophils Absolute: 0 10*3/uL (ref 0.0–0.1)
Basophils Relative: 0.3 % (ref 0.0–3.0)
Eosinophils Absolute: 0.1 10*3/uL (ref 0.0–0.7)
Eosinophils Relative: 0.9 % (ref 0.0–5.0)
HCT: 47.9 % (ref 39.0–52.0)
Hemoglobin: 16.5 g/dL (ref 13.0–17.0)
Lymphocytes Relative: 28.7 % (ref 12.0–46.0)
Lymphs Abs: 2.4 10*3/uL (ref 0.7–4.0)
MCHC: 34.4 g/dL (ref 30.0–36.0)
MCV: 88.6 fl (ref 78.0–100.0)
Monocytes Absolute: 0.6 10*3/uL (ref 0.1–1.0)
Monocytes Relative: 7.8 % (ref 3.0–12.0)
Neutro Abs: 5.2 10*3/uL (ref 1.4–7.7)
Neutrophils Relative %: 62.3 % (ref 43.0–77.0)
Platelets: 291 10*3/uL (ref 150.0–400.0)
RBC: 5.41 Mil/uL (ref 4.22–5.81)
RDW: 13.6 % (ref 11.5–14.6)
WBC: 8.3 10*3/uL (ref 4.5–10.5)

## 2011-07-27 LAB — FERRITIN: Ferritin: 17.1 ng/mL — ABNORMAL LOW (ref 22.0–322.0)

## 2011-07-31 ENCOUNTER — Telehealth: Payer: Self-pay | Admitting: *Deleted

## 2011-07-31 NOTE — Telephone Encounter (Signed)
Message copied by Daphine Deutscher on Mon Jul 31, 2011  1:04 PM ------      Message from: Hilarie Fredrickson      Created: Mon Jul 31, 2011 12:43 PM       Let pt know that his labs look good. Repeat phlebotomy in 6 months with CBC and ferritin prior.

## 2011-07-31 NOTE — Telephone Encounter (Signed)
Patient notified of lab results

## 2011-08-02 NOTE — Telephone Encounter (Signed)
Labs in [facility] for CBC, Ferritin on 01/29/12. Note to remind patient and to set up phlebotomy 1 week after labs.

## 2011-12-18 ENCOUNTER — Other Ambulatory Visit (INDEPENDENT_AMBULATORY_CARE_PROVIDER_SITE_OTHER): Payer: BC Managed Care – PPO

## 2011-12-18 LAB — CBC WITH DIFFERENTIAL/PLATELET
Basophils Relative: 0.5 % (ref 0.0–3.0)
Eosinophils Absolute: 0.1 10*3/uL (ref 0.0–0.7)
Eosinophils Relative: 0.8 % (ref 0.0–5.0)
HCT: 47.2 % (ref 39.0–52.0)
Lymphs Abs: 2.3 10*3/uL (ref 0.7–4.0)
MCHC: 33.5 g/dL (ref 30.0–36.0)
MCV: 91.9 fl (ref 78.0–100.0)
Monocytes Absolute: 0.6 10*3/uL (ref 0.1–1.0)
Neutrophils Relative %: 66.5 % (ref 43.0–77.0)
RBC: 5.13 Mil/uL (ref 4.22–5.81)

## 2011-12-19 ENCOUNTER — Other Ambulatory Visit: Payer: Self-pay | Admitting: Internal Medicine

## 2012-02-07 ENCOUNTER — Other Ambulatory Visit (INDEPENDENT_AMBULATORY_CARE_PROVIDER_SITE_OTHER): Payer: BC Managed Care – PPO

## 2012-02-07 LAB — CBC WITH DIFFERENTIAL/PLATELET
Basophils Absolute: 0 10*3/uL (ref 0.0–0.1)
Eosinophils Absolute: 0.1 10*3/uL (ref 0.0–0.7)
Eosinophils Relative: 0.5 % (ref 0.0–5.0)
HCT: 49.4 % (ref 39.0–52.0)
Lymphs Abs: 2.6 10*3/uL (ref 0.7–4.0)
MCHC: 34.1 g/dL (ref 30.0–36.0)
MCV: 93.1 fl (ref 78.0–100.0)
Monocytes Absolute: 1 10*3/uL (ref 0.1–1.0)
Neutrophils Relative %: 62.1 % (ref 43.0–77.0)
Platelets: 269 10*3/uL (ref 150.0–400.0)
RDW: 12.5 % (ref 11.5–14.6)
WBC: 9.5 10*3/uL (ref 4.5–10.5)

## 2012-02-07 LAB — FERRITIN: Ferritin: 17 ng/mL — ABNORMAL LOW (ref 22.0–322.0)

## 2012-02-08 ENCOUNTER — Other Ambulatory Visit: Payer: Self-pay | Admitting: Internal Medicine

## 2012-05-29 ENCOUNTER — Other Ambulatory Visit (INDEPENDENT_AMBULATORY_CARE_PROVIDER_SITE_OTHER): Payer: BC Managed Care – PPO

## 2012-05-29 LAB — CBC WITH DIFFERENTIAL/PLATELET
Basophils Absolute: 0 10*3/uL (ref 0.0–0.1)
Eosinophils Relative: 0.8 % (ref 0.0–5.0)
HCT: 52.8 % — ABNORMAL HIGH (ref 39.0–52.0)
Lymphocytes Relative: 26.5 % (ref 12.0–46.0)
Lymphs Abs: 2.5 10*3/uL (ref 0.7–4.0)
Monocytes Relative: 7.3 % (ref 3.0–12.0)
Neutrophils Relative %: 65.1 % (ref 43.0–77.0)
Platelets: 277 10*3/uL (ref 150.0–400.0)
RDW: 12.6 % (ref 11.5–14.6)
WBC: 9.3 10*3/uL (ref 4.5–10.5)

## 2012-05-30 ENCOUNTER — Other Ambulatory Visit: Payer: Self-pay | Admitting: Internal Medicine

## 2012-10-30 ENCOUNTER — Other Ambulatory Visit (INDEPENDENT_AMBULATORY_CARE_PROVIDER_SITE_OTHER): Payer: BC Managed Care – PPO

## 2012-10-30 LAB — CBC WITH DIFFERENTIAL/PLATELET
Basophils Relative: 0.2 % (ref 0.0–3.0)
Eosinophils Absolute: 0.1 10*3/uL (ref 0.0–0.7)
Eosinophils Relative: 0.8 % (ref 0.0–5.0)
HCT: 47.5 % (ref 39.0–52.0)
Lymphs Abs: 3 10*3/uL (ref 0.7–4.0)
MCHC: 33.8 g/dL (ref 30.0–36.0)
MCV: 89.5 fl (ref 78.0–100.0)
Monocytes Absolute: 0.9 10*3/uL (ref 0.1–1.0)
Neutro Abs: 6.8 10*3/uL (ref 1.4–7.7)
Neutrophils Relative %: 62.5 % (ref 43.0–77.0)
RBC: 5.3 Mil/uL (ref 4.22–5.81)
WBC: 10.8 10*3/uL — ABNORMAL HIGH (ref 4.5–10.5)

## 2012-10-31 ENCOUNTER — Other Ambulatory Visit: Payer: Self-pay | Admitting: Gastroenterology

## 2013-05-20 ENCOUNTER — Other Ambulatory Visit (INDEPENDENT_AMBULATORY_CARE_PROVIDER_SITE_OTHER): Payer: BC Managed Care – PPO

## 2013-05-20 ENCOUNTER — Other Ambulatory Visit: Payer: Self-pay

## 2013-05-20 LAB — CBC WITH DIFFERENTIAL/PLATELET
Basophils Absolute: 0.1 10*3/uL (ref 0.0–0.1)
Eosinophils Absolute: 0.1 10*3/uL (ref 0.0–0.7)
Eosinophils Relative: 0.6 % (ref 0.0–5.0)
HCT: 49 % (ref 39.0–52.0)
Lymphs Abs: 2.8 10*3/uL (ref 0.7–4.0)
MCHC: 34.2 g/dL (ref 30.0–36.0)
MCV: 89.5 fl (ref 78.0–100.0)
Monocytes Absolute: 1 10*3/uL (ref 0.1–1.0)
Neutrophils Relative %: 70.8 % (ref 43.0–77.0)
Platelets: 292 10*3/uL (ref 150.0–400.0)
RDW: 13.4 % (ref 11.5–14.6)
WBC: 13.8 10*3/uL — ABNORMAL HIGH (ref 4.5–10.5)

## 2013-05-20 LAB — FERRITIN: Ferritin: 36.1 ng/mL (ref 22.0–322.0)

## 2013-05-20 LAB — HEPATIC FUNCTION PANEL
AST: 20 U/L (ref 0–37)
Albumin: 4 g/dL (ref 3.5–5.2)
Total Bilirubin: 0.8 mg/dL (ref 0.3–1.2)

## 2013-11-18 ENCOUNTER — Other Ambulatory Visit (INDEPENDENT_AMBULATORY_CARE_PROVIDER_SITE_OTHER): Payer: BC Managed Care – PPO

## 2013-11-18 LAB — CBC WITH DIFFERENTIAL/PLATELET
BASOS ABS: 0.2 10*3/uL — AB (ref 0.0–0.1)
Basophils Relative: 1.8 % (ref 0.0–3.0)
Eosinophils Absolute: 0.1 10*3/uL (ref 0.0–0.7)
Eosinophils Relative: 0.9 % (ref 0.0–5.0)
HCT: 48.9 % (ref 39.0–52.0)
Hemoglobin: 16.9 g/dL (ref 13.0–17.0)
LYMPHS PCT: 25.4 % (ref 12.0–46.0)
Lymphs Abs: 2.5 10*3/uL (ref 0.7–4.0)
MCHC: 34.5 g/dL (ref 30.0–36.0)
MCV: 91.7 fl (ref 78.0–100.0)
MONOS PCT: 6.3 % (ref 3.0–12.0)
Monocytes Absolute: 0.6 10*3/uL (ref 0.1–1.0)
Neutro Abs: 6.5 10*3/uL (ref 1.4–7.7)
Neutrophils Relative %: 65.6 % (ref 43.0–77.0)
Platelets: 265 10*3/uL (ref 150.0–400.0)
RBC: 5.34 Mil/uL (ref 4.22–5.81)
RDW: 12.8 % (ref 11.5–14.6)
WBC: 10 10*3/uL (ref 4.5–10.5)

## 2013-11-18 LAB — FERRITIN: Ferritin: 56.8 ng/mL (ref 22.0–322.0)

## 2013-11-19 ENCOUNTER — Other Ambulatory Visit: Payer: Self-pay

## 2014-05-04 ENCOUNTER — Telehealth: Payer: Self-pay

## 2014-05-04 NOTE — Telephone Encounter (Signed)
Pt aware and orders in epic. 

## 2014-05-04 NOTE — Telephone Encounter (Signed)
Message copied by Chrystie NoseHUNT, Kenetha Cozza R on Mon May 04, 2014 10:51 AM ------      Message from: Hildy Nicholl, [place] R      Created: Wed Nov 19, 2013 10:16 AM      Regarding: Labs       Pt needs labs, orders in epic. ------

## 2014-05-08 ENCOUNTER — Other Ambulatory Visit (INDEPENDENT_AMBULATORY_CARE_PROVIDER_SITE_OTHER): Payer: 59

## 2014-05-08 LAB — FERRITIN: FERRITIN: 38.3 ng/mL (ref 22.0–322.0)

## 2014-05-08 LAB — CBC WITH DIFFERENTIAL/PLATELET
BASOS PCT: 0.4 % (ref 0.0–3.0)
Basophils Absolute: 0 10*3/uL (ref 0.0–0.1)
EOS ABS: 0.1 10*3/uL (ref 0.0–0.7)
EOS PCT: 0.8 % (ref 0.0–5.0)
HCT: 49 % (ref 39.0–52.0)
HEMOGLOBIN: 16.3 g/dL (ref 13.0–17.0)
LYMPHS PCT: 22.1 % (ref 12.0–46.0)
Lymphs Abs: 2.5 10*3/uL (ref 0.7–4.0)
MCHC: 33.3 g/dL (ref 30.0–36.0)
MCV: 90.3 fl (ref 78.0–100.0)
Monocytes Absolute: 1 10*3/uL (ref 0.1–1.0)
Monocytes Relative: 9.1 % (ref 3.0–12.0)
NEUTROS ABS: 7.7 10*3/uL (ref 1.4–7.7)
Neutrophils Relative %: 67.6 % (ref 43.0–77.0)
Platelets: 304 10*3/uL (ref 150.0–400.0)
RBC: 5.42 Mil/uL (ref 4.22–5.81)
RDW: 12.5 % (ref 11.5–15.5)
WBC: 11.4 10*3/uL — ABNORMAL HIGH (ref 4.0–10.5)

## 2014-05-08 LAB — HEPATIC FUNCTION PANEL
ALBUMIN: 3.6 g/dL (ref 3.5–5.2)
ALK PHOS: 43 U/L (ref 39–117)
ALT: 33 U/L (ref 0–53)
AST: 22 U/L (ref 0–37)
Bilirubin, Direct: 0.1 mg/dL (ref 0.0–0.3)
Total Bilirubin: 0.8 mg/dL (ref 0.2–1.2)
Total Protein: 7.5 g/dL (ref 6.0–8.3)

## 2014-05-11 ENCOUNTER — Other Ambulatory Visit: Payer: Self-pay

## 2014-08-04 ENCOUNTER — Telehealth: Payer: Self-pay

## 2014-08-04 NOTE — Telephone Encounter (Signed)
Pt aware.

## 2014-08-04 NOTE — Telephone Encounter (Signed)
-----   Message from Lily LovingsLinda Ray Hunt, RN sent at 05/11/2014  1:44 PM EDT ----- Regarding: labs Needs labs, order in

## 2014-08-07 ENCOUNTER — Other Ambulatory Visit (INDEPENDENT_AMBULATORY_CARE_PROVIDER_SITE_OTHER): Payer: 59

## 2014-08-07 LAB — FERRITIN: FERRITIN: 13.1 ng/mL — AB (ref 22.0–322.0)

## 2014-08-11 ENCOUNTER — Other Ambulatory Visit: Payer: Self-pay

## 2014-08-11 DIAGNOSIS — R7989 Other specified abnormal findings of blood chemistry: Secondary | ICD-10-CM

## 2014-08-11 DIAGNOSIS — D509 Iron deficiency anemia, unspecified: Secondary | ICD-10-CM

## 2014-08-11 DIAGNOSIS — R945 Abnormal results of liver function studies: Principal | ICD-10-CM

## 2015-02-10 ENCOUNTER — Telehealth: Payer: Self-pay

## 2015-02-10 NOTE — Telephone Encounter (Signed)
Pt aware.

## 2015-02-10 NOTE — Telephone Encounter (Signed)
-----   Message from Lily LovingsLinda Ray Siddiq Kaluzny, RN sent at 08/11/2014  9:18 AM EST ----- Regarding: Labs Pt needs labs in July, orders in epic.

## 2015-02-12 ENCOUNTER — Other Ambulatory Visit (INDEPENDENT_AMBULATORY_CARE_PROVIDER_SITE_OTHER): Payer: 59

## 2015-02-12 DIAGNOSIS — D509 Iron deficiency anemia, unspecified: Secondary | ICD-10-CM | POA: Diagnosis not present

## 2015-02-12 DIAGNOSIS — R7989 Other specified abnormal findings of blood chemistry: Secondary | ICD-10-CM | POA: Diagnosis not present

## 2015-02-12 DIAGNOSIS — R945 Abnormal results of liver function studies: Secondary | ICD-10-CM

## 2015-02-12 LAB — CBC WITH DIFFERENTIAL/PLATELET
BASOS PCT: 0.5 % (ref 0.0–3.0)
Basophils Absolute: 0.1 10*3/uL (ref 0.0–0.1)
EOS PCT: 0.7 % (ref 0.0–5.0)
Eosinophils Absolute: 0.1 10*3/uL (ref 0.0–0.7)
HCT: 49.8 % (ref 39.0–52.0)
HEMOGLOBIN: 16.7 g/dL (ref 13.0–17.0)
Lymphocytes Relative: 24.8 % (ref 12.0–46.0)
Lymphs Abs: 2.6 10*3/uL (ref 0.7–4.0)
MCHC: 33.6 g/dL (ref 30.0–36.0)
MCV: 90.8 fl (ref 78.0–100.0)
MONO ABS: 0.9 10*3/uL (ref 0.1–1.0)
MONOS PCT: 8.7 % (ref 3.0–12.0)
NEUTROS ABS: 6.9 10*3/uL (ref 1.4–7.7)
NEUTROS PCT: 65.3 % (ref 43.0–77.0)
Platelets: 282 10*3/uL (ref 150.0–400.0)
RBC: 5.49 Mil/uL (ref 4.22–5.81)
RDW: 13.1 % (ref 11.5–15.5)
WBC: 10.6 10*3/uL — ABNORMAL HIGH (ref 4.0–10.5)

## 2015-02-12 LAB — HEPATIC FUNCTION PANEL
ALK PHOS: 56 U/L (ref 39–117)
ALT: 35 U/L (ref 0–53)
AST: 22 U/L (ref 0–37)
Albumin: 4.3 g/dL (ref 3.5–5.2)
BILIRUBIN DIRECT: 0.2 mg/dL (ref 0.0–0.3)
Total Bilirubin: 0.6 mg/dL (ref 0.2–1.2)
Total Protein: 7.2 g/dL (ref 6.0–8.3)

## 2015-02-12 LAB — FERRITIN: Ferritin: 29 ng/mL (ref 22.0–322.0)

## 2015-02-15 ENCOUNTER — Other Ambulatory Visit: Payer: Self-pay

## 2015-08-16 ENCOUNTER — Telehealth: Payer: Self-pay | Admitting: Internal Medicine

## 2015-08-17 NOTE — Telephone Encounter (Signed)
Left message for pt to call back  °

## 2015-08-17 NOTE — Telephone Encounter (Signed)
Spoke with pt and he is aware that he is due for labs in January. Orders are in epic.

## 2015-08-18 ENCOUNTER — Other Ambulatory Visit: Payer: Self-pay

## 2015-08-18 ENCOUNTER — Other Ambulatory Visit (INDEPENDENT_AMBULATORY_CARE_PROVIDER_SITE_OTHER): Payer: 59

## 2015-08-18 LAB — FERRITIN: Ferritin: 84 ng/mL (ref 22.0–322.0)

## 2015-08-18 LAB — CBC WITH DIFFERENTIAL/PLATELET
Basophils Absolute: 0.1 10*3/uL (ref 0.0–0.1)
Basophils Relative: 1 % (ref 0.0–3.0)
EOS ABS: 0.1 10*3/uL (ref 0.0–0.7)
Eosinophils Relative: 0.8 % (ref 0.0–5.0)
HEMATOCRIT: 48.6 % (ref 39.0–52.0)
Hemoglobin: 16.5 g/dL (ref 13.0–17.0)
Lymphocytes Relative: 26 % (ref 12.0–46.0)
Lymphs Abs: 2.8 10*3/uL (ref 0.7–4.0)
MCHC: 33.9 g/dL (ref 30.0–36.0)
MCV: 91.9 fl (ref 78.0–100.0)
MONOS PCT: 9.3 % (ref 3.0–12.0)
Monocytes Absolute: 1 10*3/uL (ref 0.1–1.0)
NEUTROS ABS: 6.8 10*3/uL (ref 1.4–7.7)
Neutrophils Relative %: 62.9 % (ref 43.0–77.0)
PLATELETS: 260 10*3/uL (ref 150.0–400.0)
RBC: 5.29 Mil/uL (ref 4.22–5.81)
RDW: 13.3 % (ref 11.5–15.5)
WBC: 10.8 10*3/uL — AB (ref 4.0–10.5)

## 2015-08-18 LAB — HEPATIC FUNCTION PANEL
ALK PHOS: 45 U/L (ref 39–117)
ALT: 52 U/L (ref 0–53)
AST: 26 U/L (ref 0–37)
Albumin: 4.1 g/dL (ref 3.5–5.2)
Bilirubin, Direct: 0.1 mg/dL (ref 0.0–0.3)
TOTAL PROTEIN: 6.9 g/dL (ref 6.0–8.3)
Total Bilirubin: 0.6 mg/dL (ref 0.2–1.2)

## 2015-08-20 ENCOUNTER — Other Ambulatory Visit (INDEPENDENT_AMBULATORY_CARE_PROVIDER_SITE_OTHER): Payer: 59

## 2015-08-20 LAB — CBC WITH DIFFERENTIAL/PLATELET
BASOS ABS: 0.1 10*3/uL (ref 0.0–0.1)
Basophils Relative: 1.3 % (ref 0.0–3.0)
EOS ABS: 0.1 10*3/uL (ref 0.0–0.7)
Eosinophils Relative: 0.8 % (ref 0.0–5.0)
HEMATOCRIT: 48.2 % (ref 39.0–52.0)
HEMOGLOBIN: 16.4 g/dL (ref 13.0–17.0)
LYMPHS PCT: 25.6 % (ref 12.0–46.0)
Lymphs Abs: 2.8 10*3/uL (ref 0.7–4.0)
MCHC: 34 g/dL (ref 30.0–36.0)
MCV: 91.7 fl (ref 78.0–100.0)
Monocytes Absolute: 0.9 10*3/uL (ref 0.1–1.0)
Monocytes Relative: 7.8 % (ref 3.0–12.0)
Neutro Abs: 7.1 10*3/uL (ref 1.4–7.7)
Neutrophils Relative %: 64.5 % (ref 43.0–77.0)
Platelets: 270 10*3/uL (ref 150.0–400.0)
RBC: 5.26 Mil/uL (ref 4.22–5.81)
RDW: 12.9 % (ref 11.5–15.5)
WBC: 11 10*3/uL — AB (ref 4.0–10.5)

## 2015-08-20 LAB — FERRITIN: Ferritin: 99.9 ng/mL (ref 22.0–322.0)

## 2016-02-07 ENCOUNTER — Telehealth: Payer: Self-pay

## 2016-02-07 NOTE — Telephone Encounter (Signed)
Pt states he moved to [place] and [facility].

## 2016-02-07 NOTE — Telephone Encounter (Signed)
-----   Message from Chrystie NoseLinda R Hunt, RN sent at 08/18/2015  4:25 PM EST ----- Regarding: labs Pt needs labs in 6 mths, orders in epic.
